# Patient Record
Sex: Female | Born: 1990 | Race: Black or African American | Hispanic: No | Marital: Single | Smoking: Former smoker
Health system: Southern US, Community
[De-identification: ages and names within clinical notes are randomized; demographics above are authoritative.]

## PROBLEM LIST (undated history)

## (undated) ENCOUNTER — Inpatient Hospital Stay (HOSPITAL_COMMUNITY): Payer: Self-pay

## (undated) DIAGNOSIS — D219 Benign neoplasm of connective and other soft tissue, unspecified: Secondary | ICD-10-CM

---

## 2013-08-21 ENCOUNTER — Inpatient Hospital Stay (HOSPITAL_COMMUNITY): Payer: BC Managed Care – PPO

## 2013-08-21 ENCOUNTER — Inpatient Hospital Stay (HOSPITAL_COMMUNITY)
Admission: AD | Admit: 2013-08-21 | Discharge: 2013-08-21 | Disposition: A | Discharge status: Home / Self Care | Payer: BC Managed Care – PPO | Source: Ambulatory Visit | Attending: Obstetrics & Gynecology | Admitting: Obstetrics & Gynecology

## 2013-08-21 ENCOUNTER — Encounter (HOSPITAL_COMMUNITY): Payer: Self-pay | Admitting: *Deleted

## 2013-08-21 DIAGNOSIS — R109 Unspecified abdominal pain: Secondary | ICD-10-CM | POA: Insufficient documentation

## 2013-08-21 DIAGNOSIS — O2390 Unspecified genitourinary tract infection in pregnancy, unspecified trimester: Secondary | ICD-10-CM | POA: Insufficient documentation

## 2013-08-21 DIAGNOSIS — O9989 Other specified diseases and conditions complicating pregnancy, childbirth and the puerperium: Secondary | ICD-10-CM

## 2013-08-21 DIAGNOSIS — A499 Bacterial infection, unspecified: Secondary | ICD-10-CM | POA: Insufficient documentation

## 2013-08-21 DIAGNOSIS — Z3490 Encounter for supervision of normal pregnancy, unspecified, unspecified trimester: Secondary | ICD-10-CM

## 2013-08-21 DIAGNOSIS — N760 Acute vaginitis: Secondary | ICD-10-CM | POA: Insufficient documentation

## 2013-08-21 DIAGNOSIS — B9689 Other specified bacterial agents as the cause of diseases classified elsewhere: Secondary | ICD-10-CM | POA: Insufficient documentation

## 2013-08-21 DIAGNOSIS — O26899 Other specified pregnancy related conditions, unspecified trimester: Secondary | ICD-10-CM

## 2013-08-21 LAB — WET PREP, GENITAL
Trich, Wet Prep: NONE SEEN
Yeast Wet Prep HPF POC: NONE SEEN

## 2013-08-21 LAB — URINALYSIS, ROUTINE W REFLEX MICROSCOPIC
Bilirubin Urine: NEGATIVE
Glucose, UA: NEGATIVE mg/dL
Ketones, ur: NEGATIVE mg/dL
Leukocytes, UA: NEGATIVE
Protein, ur: NEGATIVE mg/dL
pH: 7.5 (ref 5.0–8.0)

## 2013-08-21 LAB — CBC
Hemoglobin: 11.8 g/dL — ABNORMAL LOW (ref 12.0–15.0)
MCH: 28.2 pg (ref 26.0–34.0)
MCHC: 33.3 g/dL (ref 30.0–36.0)
MCV: 84.7 fL (ref 78.0–100.0)
RBC: 4.18 MIL/uL (ref 3.87–5.11)

## 2013-08-21 MED ORDER — TRAMADOL HCL 50 MG PO TABS
50.0000 mg | ORAL_TABLET | Freq: Four times a day (QID) | ORAL | Status: DC | PRN
Start: 2013-08-21 — End: 2014-03-25

## 2013-08-21 MED ORDER — METRONIDAZOLE 500 MG PO TABS: 500.0000 mg | ORAL_TABLET | Freq: Two times a day (BID) | ORAL | Status: DC

## 2013-08-21 NOTE — MAU Note (Signed)
Patient presents to MAU with lower abdominal cramping; unsure of last period.

## 2013-08-21 NOTE — MAU Note (Signed)
Lower abd cramping & pressure since last week, +HPT last Friday, denies bleeding.

## 2013-08-21 NOTE — MAU Provider Note (Signed)
History     CSN: [id number]  Arrival date and time: 08/21/13 1701   First Provider Initiated Contact with Patient 08/21/13 2026      Chief Complaint  Patient presents with  . Abdominal Pain   HPI  [name] is a 22 y.o. female; G2P0010 at Unknown gestational age who presents to MAU with abdominal pain. She had a positive pregnancy test at home this past Friday. She felt like she was "coming on her period" due to the abdominal cramps she was experiencing. She continues to have mild-moderate abdominal cramping in the lower abdominal area. The pain at times is worse on her right side.  She has not tried to take anything for the pain; considered taking tylenol however was unsure if something was seriously wrong. LMP is unknown, she does not recall when the last time her "period came on".   OB History   Grav Para Term Preterm Abortions TAB SAB Ect Mult Living   1               History reviewed. No pertinent past medical history.  History reviewed. No pertinent past surgical history.  History reviewed. No pertinent family history.  History  Substance Use Topics  . Smoking status: Former Smoker    Quit date: 08/17/2013  . Smokeless tobacco: Never Used  . Alcohol Use: No    Allergies: Allergies not on file  No prescriptions prior to admission    Review of Systems  Constitutional: Negative for fever and chills.  Gastrointestinal: Positive for abdominal pain. Negative for nausea, vomiting, diarrhea and constipation.       Bilateral lower abdominal pain; at times worse on the right side.   Genitourinary: Negative for dysuria, urgency and frequency.       No vaginal discharge. No vaginal bleeding. No dysuria.   Neurological: Negative for dizziness and headaches.   Physical Exam   Blood pressure 115/82, pulse 94, temperature 98.1 F (36.7 C), temperature source Oral, resp. rate 20, height 5\' 2"  (1.575 m), weight 68.584 kg (151 lb 3.2 oz).  Physical Exam   Constitutional: She is oriented to person, place, and time. She appears well-developed and well-nourished. No distress.  Neck: Neck supple.  GI: Soft. There is tenderness. There is no rebound and no guarding.  Right lower quadrant pain   Genitourinary:  Speculum exam: Vagina - Small amount of creamy discharge, no odor Cervix - scant contact bleeding Bimanual exam: Cervix closed Uterus non tender, Gravid; normal size Right Adnexal tenderness, no masses bilaterally GC/Chlam, wet prep done Chaperone present for exam.   Neurological: She is alert and oriented to person, place, and time.  Skin: Skin is warm and dry. She is not diaphoretic.    MAU Course  Procedures  MDM Wet prep GC/Chlamydia-pending  [place] Beta Hcg Declines the need for pain medication at this time  O positive blood type Report given to [place] CNM who resumes care of this patient   Assessment and Plan   Darlene Alar FNP-C 08/21/2013, 9:24 PM   Care of patient assumed by Dorathy Kinsman, CNM at 2100. Labs and ultrasound and process.  Results for orders placed during the hospital encounter of 08/21/13 (from the past 24 hour(s))  URINALYSIS, ROUTINE W REFLEX MICROSCOPIC     Status: None   Collection Time    08/21/13  5:08 PM      Result Value Range   Color, Urine YELLOW  YELLOW   APPearance CLEAR  CLEAR  Specific Gravity, Urine 1.020  1.005 - 1.030   pH 7.5  5.0 - 8.0   Glucose, UA NEGATIVE  NEGATIVE mg/dL   Hgb urine dipstick NEGATIVE  NEGATIVE   Bilirubin Urine NEGATIVE  NEGATIVE   Ketones, ur NEGATIVE  NEGATIVE mg/dL   Protein, ur NEGATIVE  NEGATIVE mg/dL   Urobilinogen, UA 1.0  0.0 - 1.0 mg/dL   Nitrite NEGATIVE  NEGATIVE   Leukocytes, UA NEGATIVE  NEGATIVE  POCT PREGNANCY, URINE     Status: Abnormal   Collection Time    08/21/13  7:21 PM      Result Value Range   Preg Test, Ur POSITIVE (*) NEGATIVE  CBC     Status: Abnormal   Collection Time    08/21/13  8:40 PM      Result  Value Range   WBC 5.6  4.0 - 10.5 K/uL   RBC 4.18  3.87 - 5.11 MIL/uL   Hemoglobin 11.8 (*) 12.0 - 15.0 g/dL   HCT 08.6 (*) 57.8 - 46.9 %   MCV 84.7  78.0 - 100.0 fL   MCH 28.2  26.0 - 34.0 pg   MCHC 33.3  30.0 - 36.0 g/dL   RDW 62.9  52.8 - 41.3 %   Platelets 247  150 - 400 K/uL  ABO/RH     Status: None   Collection Time    08/21/13  8:40 PM      Result Value Range   ABO/RH(D) O POS    HCG, QUANTITATIVE, PREGNANCY     Status: Abnormal   Collection Time    08/21/13  8:40 PM      Result Value Range   hCG, Beta Chain, Quant, S 1070 (*) <5 mIU/mL  WET PREP, GENITAL     Status: Abnormal   Collection Time    08/21/13  9:19 PM      Result Value Range   Yeast Wet Prep HPF POC NONE SEEN  NONE SEEN   Trich, Wet Prep NONE SEEN  NONE SEEN   Clue Cells Wet Prep HPF POC MODERATE (*) NONE SEEN   WBC, Wet Prep HPF POC FEW (*) NONE SEEN   US Ob Comp Less 14 Wks  08/21/2013   CLINICAL DATA:  Abdominal pain.  EXAM: OBSTETRIC <14 WK [place] AND TRANSVAGINAL OB US  TECHNIQUE: Both transabdominal and transvaginal ultrasound examinations were performed for complete evaluation of the gestation as well as the maternal uterus, adnexal regions, and pelvic cul-de-sac. Transvaginal technique was performed to assess early pregnancy.  COMPARISON:  No priors.  FINDINGS: Intrauterine gestational sac: Single ovoid shaped gestational sac in the fundal portion of the endometrial canal.  Yolk sac:  None.  Embryo:  None.  Cardiac Activity: None.  MSD:  3.2  mm   4 w   5  d          [place] EDC: 04/25/2014.  Maternal uterus/adnexae: There are multiple heterogeneously isoechoic to hypoechoic lesions in the uterus, compatible with multifocal fibroids. The largest of these is in the fundal region measuring 1.8 x 2.0 x 2.6 cm. Additional lesions include a posterior lesion in the uterine body measuring 9 mm, and a 1.7 x 1.1 x 1.3 cm lesion in the lower uterine segment. Right ovary is normal in echotexture and appearance. Probable  degenerating corpus luteum cyst in the left ovary incidentally noted. No significant free fluid in the cul-de-sac.  IMPRESSION: 1. Single IUP with estimated gestational age of [redacted] weeks and 5 days.  No fetal pole identified at this time. 2. Multiple small uterine lesions likely represent fibroids, as above.   Electronically Signed   By: Trudie Reed M.D.   On: 08/21/2013 22:02   US Ob Transvaginal  08/21/2013   CLINICAL DATA:  Abdominal pain.  EXAM: OBSTETRIC <14 WK [place] AND TRANSVAGINAL OB US  TECHNIQUE: Both transabdominal and transvaginal ultrasound examinations were performed for complete evaluation of the gestation as well as the maternal uterus, adnexal regions, and pelvic cul-de-sac. Transvaginal technique was performed to assess early pregnancy.  COMPARISON:  No priors.  FINDINGS: Intrauterine gestational sac: Single ovoid shaped gestational sac in the fundal portion of the endometrial canal.  Yolk sac:  None.  Embryo:  None.  Cardiac Activity: None.  MSD:  3.2  mm   4 w   5  d          [place] EDC: 04/25/2014.  Maternal uterus/adnexae: There are multiple heterogeneously isoechoic to hypoechoic lesions in the uterus, compatible with multifocal fibroids. The largest of these is in the fundal region measuring 1.8 x 2.0 x 2.6 cm. Additional lesions include a posterior lesion in the uterine body measuring 9 mm, and a 1.7 x 1.1 x 1.3 cm lesion in the lower uterine segment. Right ovary is normal in echotexture and appearance. Probable degenerating corpus luteum cyst in the left ovary incidentally noted. No significant free fluid in the cul-de-sac.  IMPRESSION: 1. Single IUP with estimated gestational age of [redacted] weeks and 5 days. No fetal pole identified at this time. 2. Multiple small uterine lesions likely represent fibroids, as above.   Electronically Signed   By: Trudie Reed M.D.   On: 08/21/2013 22:02   MDM  ASSESSMENT: 1. Abdominal pain complicating pregnancy, antepartum   2. Pregnancy with uncertain  dates   3. BV (bacterial vaginosis)     PLAN: Discharge home in stable condition. Low suspicion for appendicitis do to absence of leukocytosis or GI symptoms, exam. Ectopic, SAB and appendicitis precautions. Follow-up Information   Follow up with [facility] In 2 days. (for followup blood work or sooner as needed if symptoms worsen)    Contact information:   [address] [id number] [place] [address] [phone]       Medication List         metroNIDAZOLE 500 MG tablet  Commonly known as:  FLAGYL  Take 1 tablet (500 mg total) by mouth 2 (two) times daily.     prenatal multivitamin Tabs tablet  Take 1 tablet by mouth daily at 12 noon.     traMADol 50 MG tablet  Commonly known as:  ULTRAM  Take 1 tablet (50 mg total) by mouth every 6 (six) hours as needed for pain.        [place], [place] 08/21/2013 10:39 PM

## 2013-08-22 LAB — GC/CHLAMYDIA PROBE AMP: GC Probe RNA: NEGATIVE

## 2013-08-22 LAB — ABO/RH: ABO/RH(D): O POS

## 2013-08-23 ENCOUNTER — Inpatient Hospital Stay (HOSPITAL_COMMUNITY)
Admission: AD | Admit: 2013-08-23 | Discharge: 2013-08-23 | Disposition: A | Discharge status: Home / Self Care | Payer: BC Managed Care – PPO | Source: Ambulatory Visit | Attending: Obstetrics & Gynecology | Admitting: Obstetrics & Gynecology

## 2013-08-23 DIAGNOSIS — O9989 Other specified diseases and conditions complicating pregnancy, childbirth and the puerperium: Secondary | ICD-10-CM

## 2013-08-23 DIAGNOSIS — O99891 Other specified diseases and conditions complicating pregnancy: Secondary | ICD-10-CM | POA: Insufficient documentation

## 2013-08-23 DIAGNOSIS — R109 Unspecified abdominal pain: Secondary | ICD-10-CM

## 2013-08-23 DIAGNOSIS — O26899 Other specified pregnancy related conditions, unspecified trimester: Secondary | ICD-10-CM

## 2013-08-23 DIAGNOSIS — O0281 Inappropriate change in quantitative human chorionic gonadotropin (hCG) in early pregnancy: Secondary | ICD-10-CM | POA: Insufficient documentation

## 2013-08-23 LAB — HCG, QUANTITATIVE, PREGNANCY: hCG, Beta Chain, Quant, S: 2500 m[IU]/mL — ABNORMAL HIGH (ref <5)

## 2013-08-23 NOTE — MAU Note (Signed)
Pt here for follow up blood work.  No bleeding, cramping continues

## 2013-08-23 NOTE — MAU Provider Note (Signed)
[name] is a 22 y.o. G1P0 at Unknown who presents to MAU today for 48 quant hCG. The patient is having mild lower abdominal cramping. She denies bleeding or other problems. Quant hCG was 1070 on 08/21/13.    GENERAL: Well-developed, well-nourished female in no acute distress.  HEENT: Normocephalic, atraumatic.   LUNGS: Effort normal HEART: Regular rate  SKIN: Warm, dry and without erythema PSYCH: Normal mood and affect  Results for orders placed during the hospital encounter of 08/23/13 (from the past 24 hour(s))  HCG, QUANTITATIVE, PREGNANCY     Status: Abnormal   Collection Time    08/23/13  4:03 PM      Result Value Range   hCG, Beta Chain, Quant, S 2500 (*) <5 mIU/mL   A: Appropriate rise in quant hCG  P: Discharge home [place] ordered for 7 days for follow-up to confirm IUP Patient will be contacted with an appointment Patient plans to start prenatal care with [facility] Patient may return to MAU as needed or if her condition were to change or worsen  Freddi Starr, PA-C 08/23/2013 5:04 PM

## 2013-08-27 NOTE — MAU Provider Note (Signed)
Attestation of Attending Supervision of Advanced Practitioner (CNM/NP): Evaluation and management procedures were performed by the Advanced Practitioner under my supervision and collaboration. I have reviewed the Advanced Practitioner's note and chart, and I agree with the management and plan.  Axzel Rockhill H. 7:46 AM

## 2013-08-30 ENCOUNTER — Ambulatory Visit (HOSPITAL_COMMUNITY)
Admission: RE | Admit: 2013-08-30 | Discharge: 2013-08-30 | Disposition: A | Discharge status: Home / Self Care | Payer: BC Managed Care – PPO | Source: Ambulatory Visit | Attending: Medical | Admitting: Medical

## 2013-08-30 DIAGNOSIS — O99891 Other specified diseases and conditions complicating pregnancy: Secondary | ICD-10-CM | POA: Insufficient documentation

## 2013-08-31 ENCOUNTER — Telehealth: Payer: Self-pay | Admitting: Medical

## 2013-08-31 NOTE — Telephone Encounter (Signed)
Called patient and discussed [place] results. She has viable IUP with cardiac activity. Patient states she has her first prenatal appointment with [facility] on Monday.   Freddi Starr, PA-C 08/31/2013 8:06 AM

## 2014-03-25 ENCOUNTER — Inpatient Hospital Stay (HOSPITAL_COMMUNITY)
Admission: AD | Admit: 2014-03-25 | Discharge: 2014-03-25 | Disposition: A | Discharge status: Home / Self Care | Payer: BC Managed Care – PPO | Source: Ambulatory Visit | Attending: Obstetrics & Gynecology | Admitting: Obstetrics & Gynecology

## 2014-03-25 ENCOUNTER — Encounter (HOSPITAL_COMMUNITY): Payer: Self-pay

## 2014-03-25 DIAGNOSIS — M549 Dorsalgia, unspecified: Secondary | ICD-10-CM | POA: Diagnosis present

## 2014-03-25 DIAGNOSIS — O99891 Other specified diseases and conditions complicating pregnancy: Secondary | ICD-10-CM | POA: Insufficient documentation

## 2014-03-25 DIAGNOSIS — R350 Frequency of micturition: Secondary | ICD-10-CM | POA: Diagnosis not present

## 2014-03-25 DIAGNOSIS — O9989 Other specified diseases and conditions complicating pregnancy, childbirth and the puerperium: Secondary | ICD-10-CM

## 2014-03-25 DIAGNOSIS — O26893 Other specified pregnancy related conditions, third trimester: Secondary | ICD-10-CM

## 2014-03-25 DIAGNOSIS — R300 Dysuria: Secondary | ICD-10-CM

## 2014-03-25 DIAGNOSIS — R319 Hematuria, unspecified: Secondary | ICD-10-CM

## 2014-03-25 HISTORY — DX: Benign neoplasm of connective and other soft tissue, unspecified: D21.9

## 2014-03-25 LAB — URINALYSIS, ROUTINE W REFLEX MICROSCOPIC
Bilirubin Urine: NEGATIVE
GLUCOSE, UA: NEGATIVE mg/dL
Ketones, ur: NEGATIVE mg/dL
LEUKOCYTES UA: NEGATIVE
Nitrite: NEGATIVE
PH: 7 (ref 5.0–8.0)
Protein, ur: NEGATIVE mg/dL
Specific Gravity, Urine: 1.02 (ref 1.005–1.030)
Urobilinogen, UA: 0.2 mg/dL (ref 0.0–1.0)

## 2014-03-25 LAB — URINE MICROSCOPIC-ADD ON

## 2014-03-25 NOTE — Discharge Instructions (Signed)

## 2014-03-25 NOTE — MAU Provider Note (Signed)
Chief Complaint:  Dysuria   First Provider Initiated Contact with Patient 03/25/14 1751      HPI: Darlene Ray is a 23 y.o. G1P0 at [redacted]w[redacted]d pt of OB/Gyn in [place] who presents to maternity admissions reporting pressure with urination.  She reports drinking lots of cranberry juice when she was concerned she may be developing a UTI.  She reports good fetal movement, denies regular contractions, LOF, vaginal bleeding, vaginal itching/burning, h/a, dizziness, n/v, or fever/chills.    Past Medical History: Past Medical History  Diagnosis Date  . Fibroid     Past obstetric history: OB History  Gravida Para Term Preterm AB SAB TAB Ectopic Multiple Living  1             # Outcome Date GA Lbr Len/2nd Weight Sex Delivery Anes PTL Lv  1 CUR               Past Surgical History: History reviewed. No pertinent past surgical history.  Family History: No family history on file.  Social History: History  Substance Use Topics  . Smoking status: Former Smoker    Quit date: 08/17/2013  . Smokeless tobacco: Never Used  . Alcohol Use: No    Allergies: No Known Allergies  Meds:  No prescriptions prior to admission    ROS: Pertinent findings in history of present illness.  Physical Exam  Blood pressure 108/77, pulse 87, temperature 98.5 F (36.9 C), resp. rate 18, height 5\' 1"  (1.549 m), weight 75.524 kg (166 lb 8 oz). GENERAL: Well-developed, well-nourished female in no acute distress.  HEENT: normocephalic HEART: normal rate RESP: normal effort ABDOMEN: Soft, non-tender, gravid appropriate for gestational age EXTREMITIES: Nontender, no edema NEURO: alert and oriented SPECULUM EXAM: Deferred     FHT:  Baseline 125, moderate variability, accelerations present, no decelerations Contractions: None on toco or to palpation   Labs: Results for orders placed during the hospital encounter of 03/25/14 (from the past 168 hour(s))  URINE CULTURE   Collection Time    03/25/14  4:05 PM       Result Value Ref Range   Specimen Description URINE, CLEAN CATCH     Special Requests NONE     Culture  Setup Time       Value: 03/25/2014 20:24     Performed at [facility]       Value: NO GROWTH     Performed at [facility]   Culture       Value: NO GROWTH     Performed at [facility]   Report Status 03/26/2014 FINAL    URINALYSIS, ROUTINE W REFLEX MICROSCOPIC   Collection Time    03/25/14  4:05 PM      Result Value Ref Range   Color, Urine YELLOW  YELLOW   APPearance CLEAR  CLEAR   Specific Gravity, Urine 1.020  1.005 - 1.030   pH 7.0  5.0 - 8.0   Glucose, UA NEGATIVE  NEGATIVE mg/dL   Hgb urine dipstick LARGE (*) NEGATIVE   Bilirubin Urine NEGATIVE  NEGATIVE   Ketones, ur NEGATIVE  NEGATIVE mg/dL   Protein, ur NEGATIVE  NEGATIVE mg/dL   Urobilinogen, UA 0.2  0.0 - 1.0 mg/dL   Nitrite NEGATIVE  NEGATIVE   Leukocytes, UA NEGATIVE  NEGATIVE  URINE MICROSCOPIC-ADD ON   Collection Time    03/25/14  4:05 PM      Result Value Ref Range   Squamous Epithelial /  LPF FEW (*) RARE   WBC, UA 0-2  <3 WBC/hpf   RBC / HPF 21-50  <3 RBC/hpf     Assessment: 1. Dysuria in pregnancy in third trimester   2. Hematuria     Plan: Discharge home PTL precautions and fetal kick counts Discussed normal changes of pregnancy, hematuria likely r/t fetal position/movement.  She should let her OB provider know so this can be retested. Drink plenty of water, decrease juice intake Urine sent for culture F/U with OB provider as scheduled      Follow-up Information   Please follow up. (With your prenatal provider in [place]. Return to MAU as needed for emergencies. )        Medication List         IRON SUPPLEMENT 325 (65 FE) MG tablet  Generic drug:  ferrous sulfate  Take 325 mg by mouth daily with breakfast.     prenatal multivitamin Tabs tablet  Take 1 tablet by mouth daily at 12 noon.     terconazole 80 MG vaginal suppository   Commonly known as:  TERAZOL 3  Place 80 mg vaginally at bedtime.        Fatima Blank Certified Nurse-Midwife 03/29/2014 10:09 PM

## 2014-03-25 NOTE — MAU Note (Signed)
Pt states had back pain Saturday, then began having urinary frequency/urgency/irritation on Sunday. Denies bleeding or vag d/c changes.

## 2014-03-26 LAB — URINE CULTURE
Colony Count: NO GROWTH
Culture: NO GROWTH

## 2014-04-02 NOTE — MAU Provider Note (Signed)
Attestation of Attending Supervision of Advanced Practitioner (CNM/NP): Evaluation and management procedures were performed by the Advanced Practitioner under my supervision and collaboration. I have reviewed the Advanced Practitioner's note and chart, and I agree with the management and plan.  Fredderick Phenix Nicholos Aloisi 1:52 PM

## 2014-06-26 ENCOUNTER — Encounter (HOSPITAL_COMMUNITY): Payer: Self-pay | Admitting: *Deleted

## 2014-10-07 ENCOUNTER — Encounter (HOSPITAL_COMMUNITY): Payer: Self-pay | Admitting: *Deleted

## 2015-04-25 IMAGING — US US OB COMP LESS 14 WK
1 series · 13 of 26 positions shown · non-contrast
Comparison: No priors.

CLINICAL DATA: Abdominal pain.

EXAM:
OBSTETRIC <14 WK US AND TRANSVAGINAL OB US
TECHNIQUE: Both transabdominal and transvaginal ultrasound examinations were
performed for complete evaluation of the gestation as well as the
maternal uterus, adnexal regions, and pelvic cul-de-sac.
Transvaginal technique was performed to assess early pregnancy.

[Series 1: us ob comp less 14 wks · 26 acquisitions, 13 frames shown]
[im 2/26]
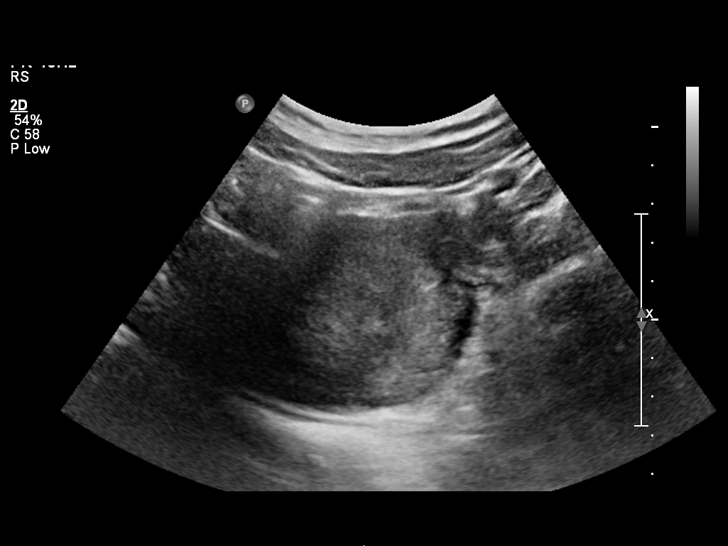
[im 4/26]
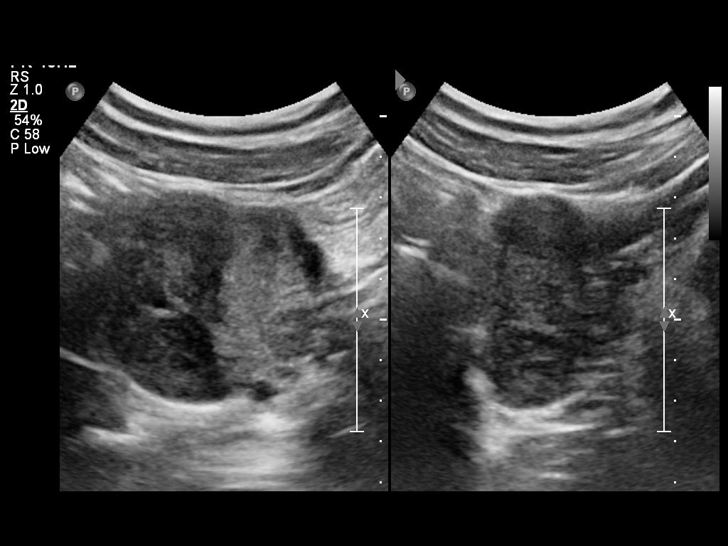
[im 6/26]
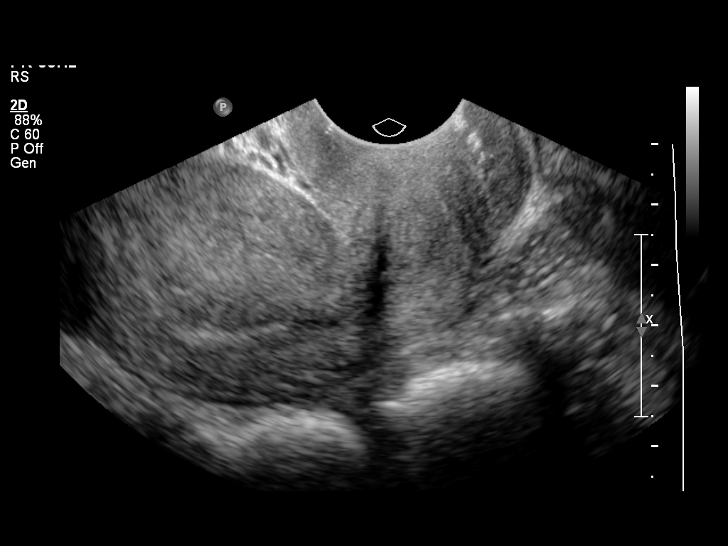
[im 8/26]
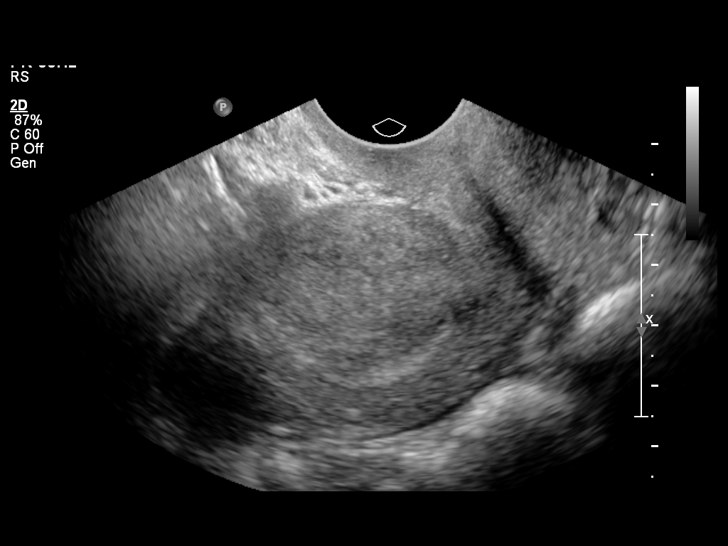
[im 10/26]
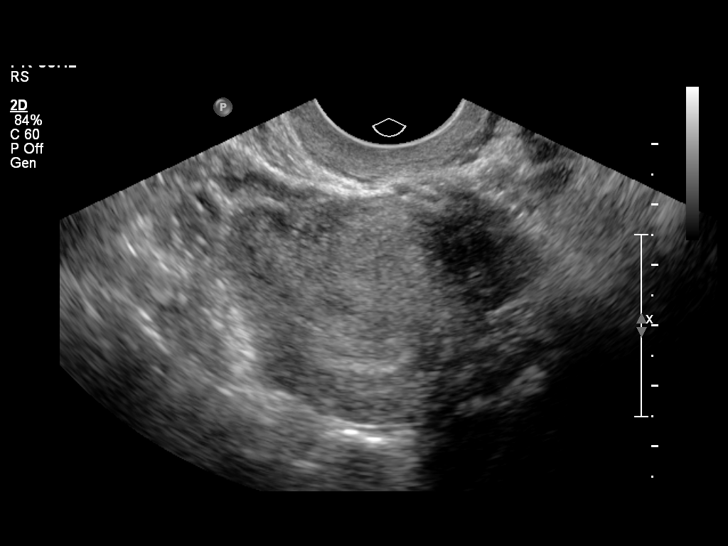
[im 12/26]
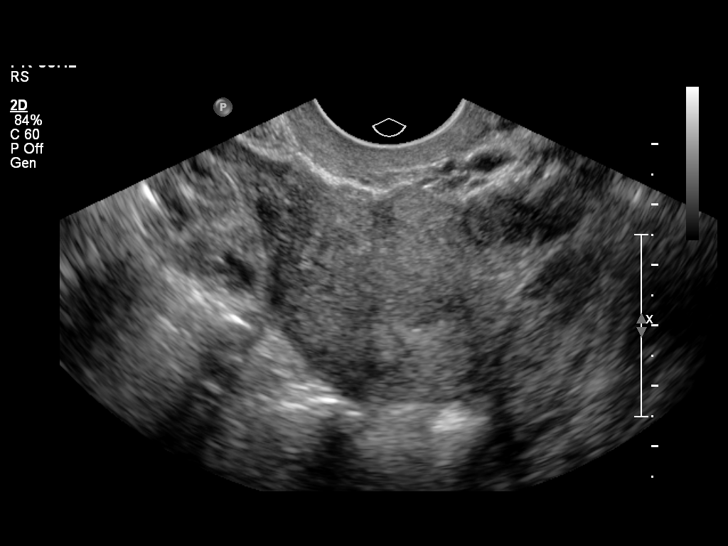
[im 14/26]
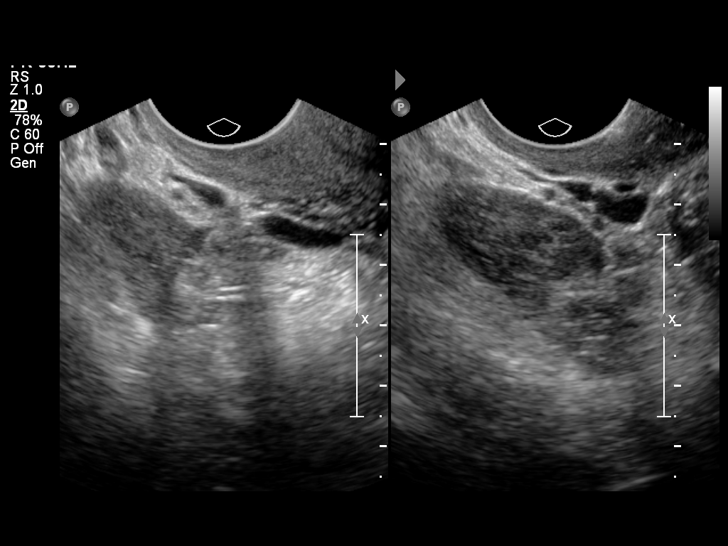
[im 16/26]
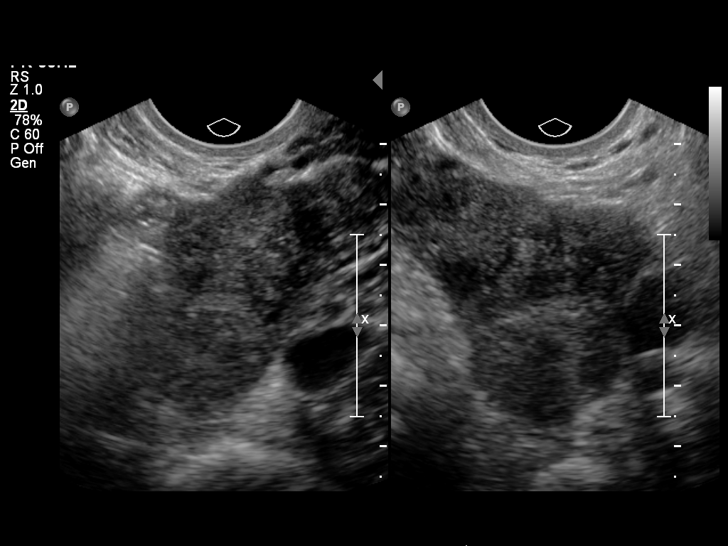
[im 18/26]
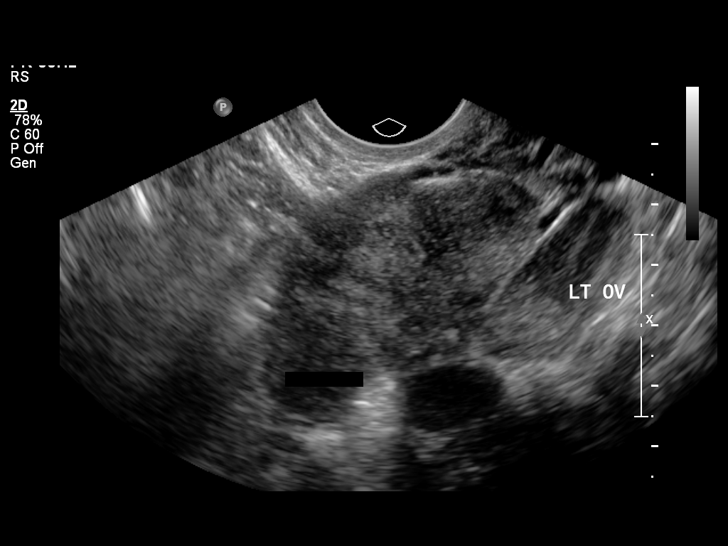
[im 20/26]
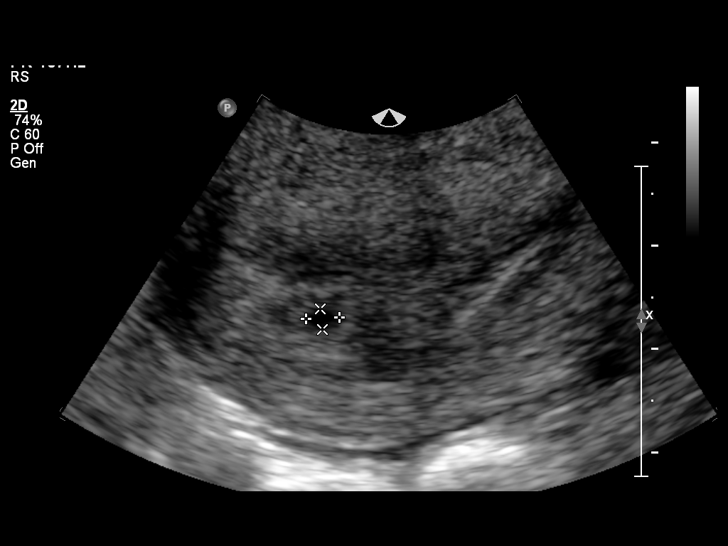
[im 22/26]
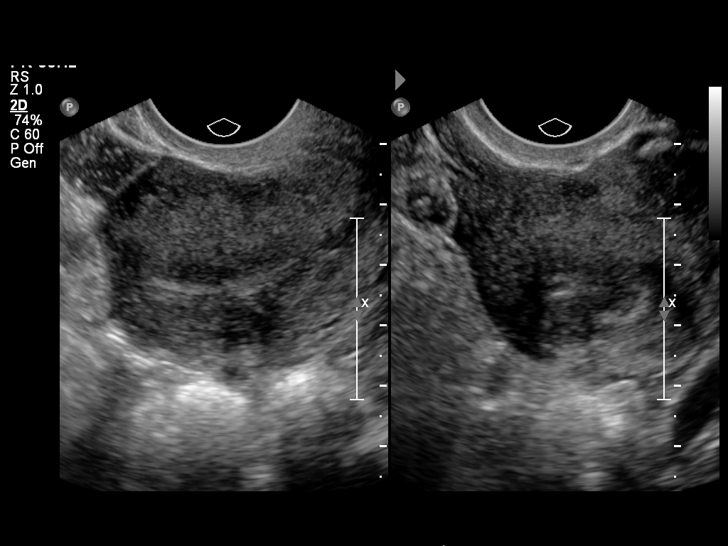
[im 24/26]
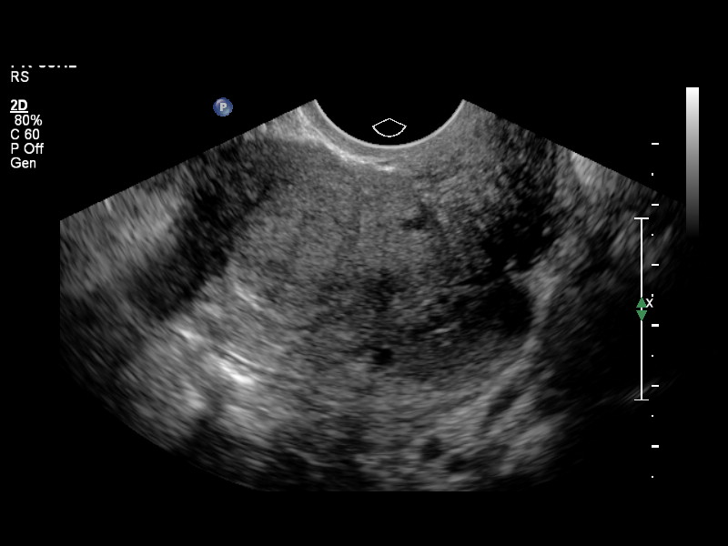
[im 26/26]
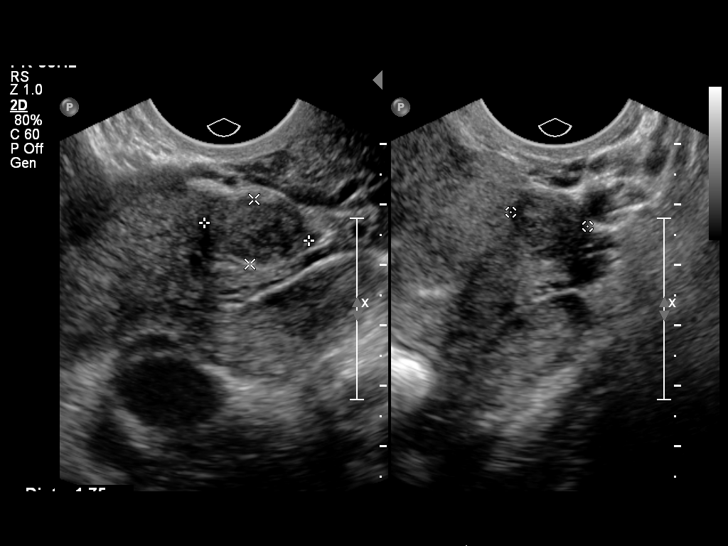

[13 of 26 positions shown; findings below may reference images not displayed]

FINDINGS: Intrauterine gestational sac: Single ovoid shaped gestational sac in
the fundal portion of the endometrial canal.

Yolk sac:  None.

Embryo:  None.

Cardiac Activity: None.

MSD:  3.2  mm   4 w   5  d          US EDC: 04/25/2014.

Maternal uterus/adnexae: There are multiple heterogeneously
isoechoic to hypoechoic lesions in the uterus, compatible with
multifocal fibroids. The largest of these is in the fundal region
measuring 1.8 x 2.0 x 2.6 cm. Additional lesions include a posterior
lesion in the uterine body measuring 9 mm, and a 1.7 x 1.1 x 1.3 cm
lesion in the lower uterine segment. Right ovary is normal in
echotexture and appearance. Probable degenerating corpus luteum cyst
in the left ovary incidentally noted. No significant free fluid in
the cul-de-sac.
IMPRESSION: 1. Single IUP with estimated gestational age of 4 weeks and 5 days.
No fetal pole identified at this time.
2. Multiple small uterine lesions likely represent fibroids, as
above.

## 2015-05-04 IMAGING — US US OB TRANSVAGINAL
1 series · 14 of 28 positions shown · non-contrast
Comparison: 08/21/2013

CLINICAL DATA: FOLLOW UP early pregnancy, gestational sac
previously seen but no fetal pole identified. Unsure dates.

EXAM:
TRANSVAGINAL OB ULTRASOUND
TECHNIQUE: Transvaginal ultrasound was performed for complete evaluation of the
gestation as well as the maternal uterus, adnexal regions, and
pelvic cul-de-sac.

[Series 1: us ob transvaginal · 14 of 47 slices shown]
[im 2/47]
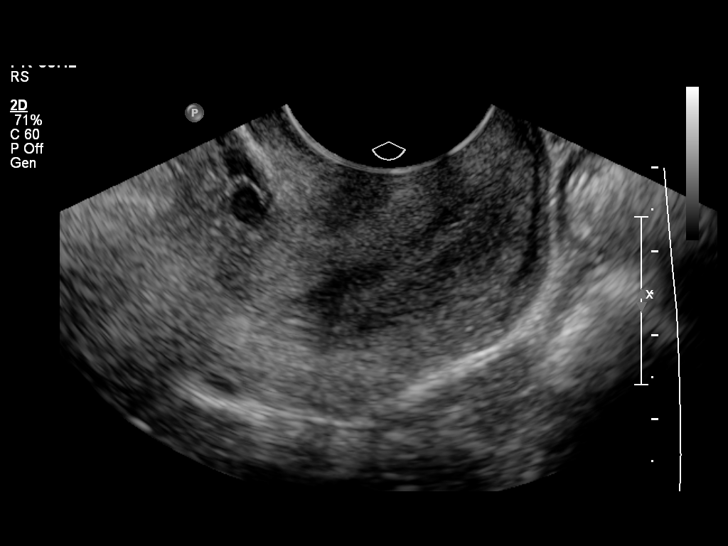
[im 6/47]
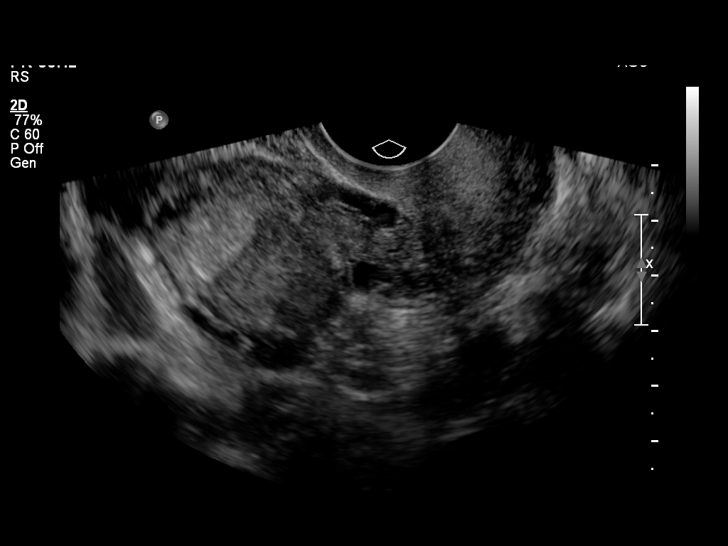
[im 9/47]
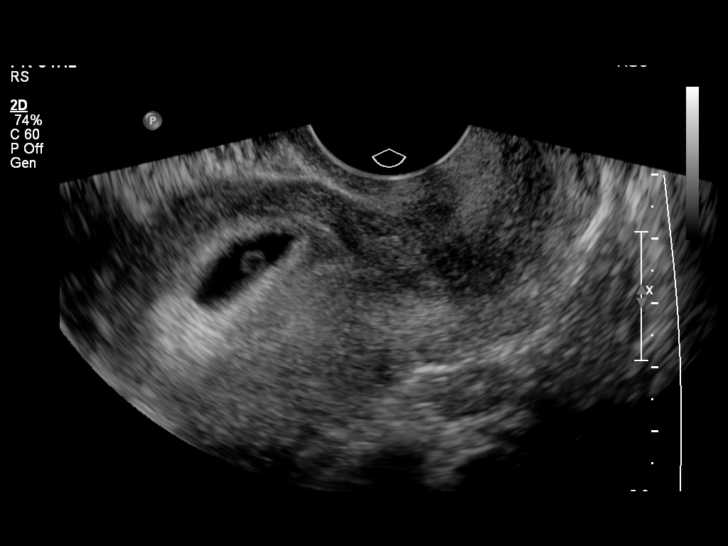
[im 12/47]
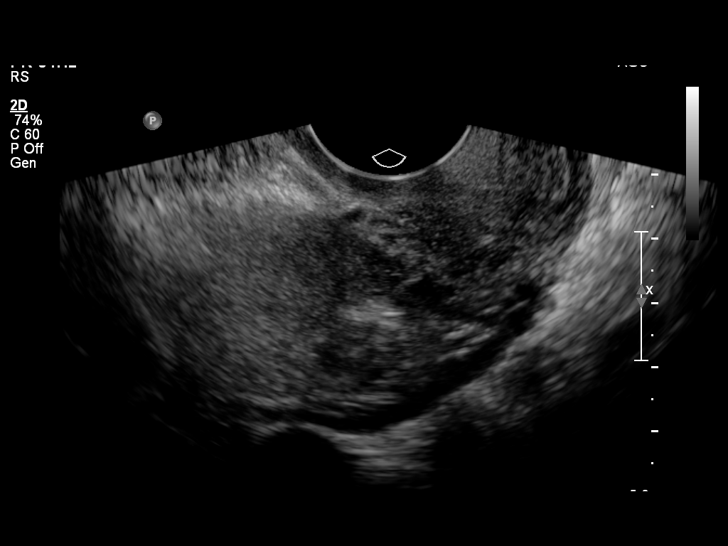
[im 16/47]
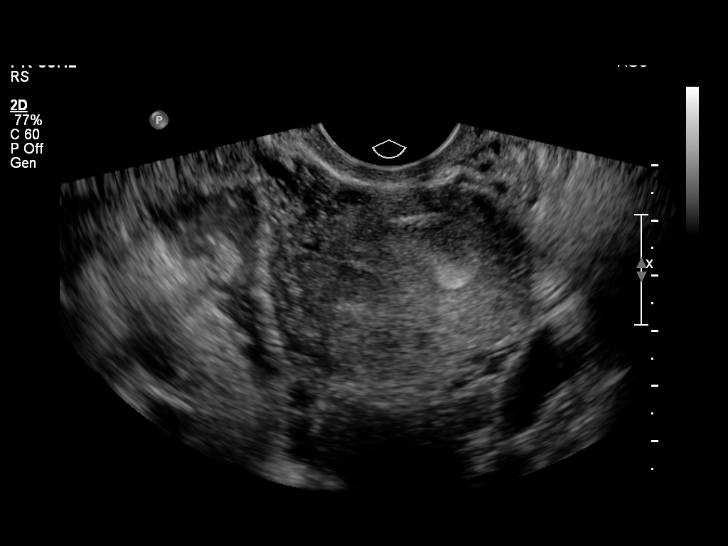
[im 19/47]
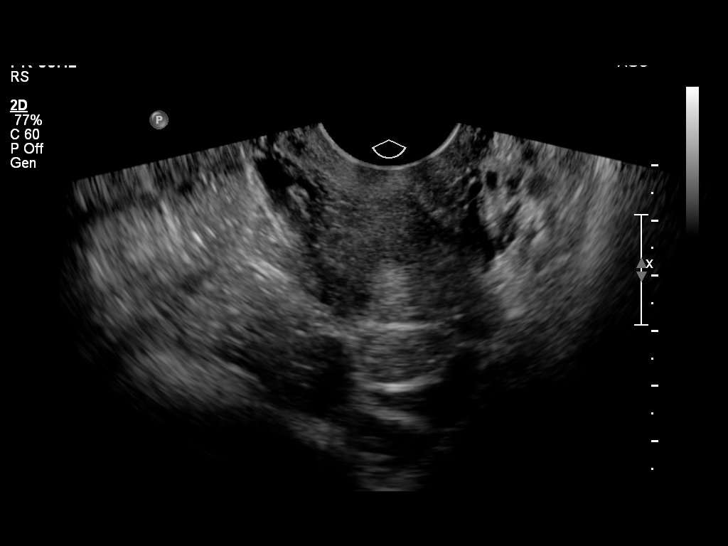
[im 23/47]
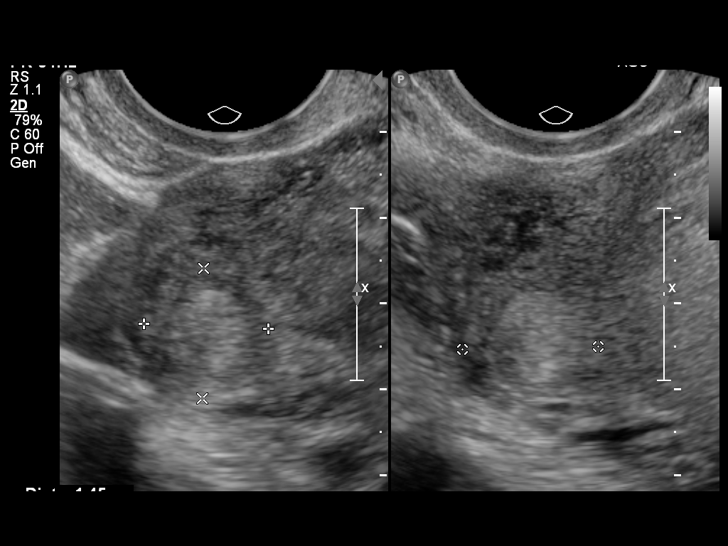
[im 26/47]
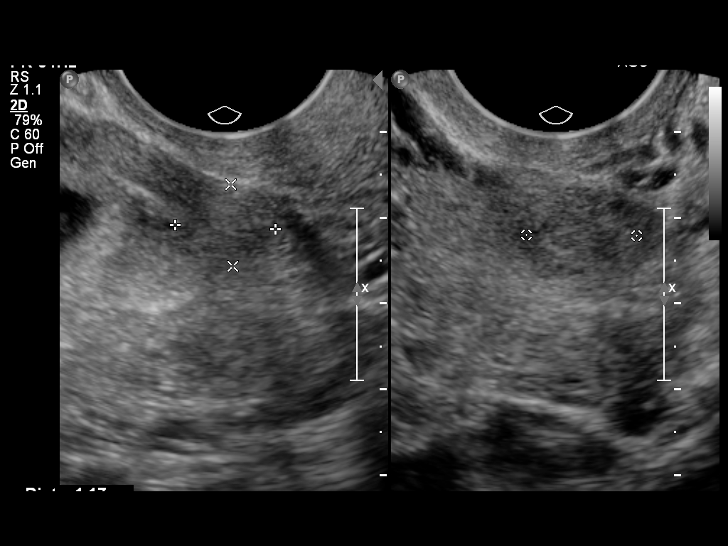
[im 29/47]
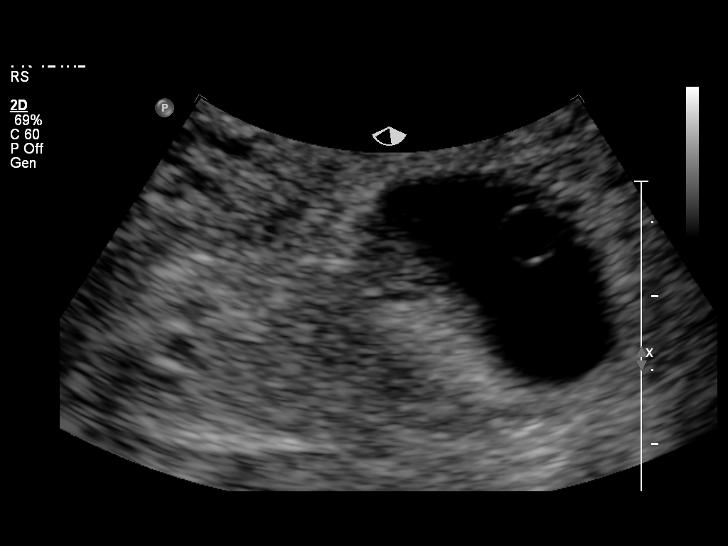
[im 33/47]
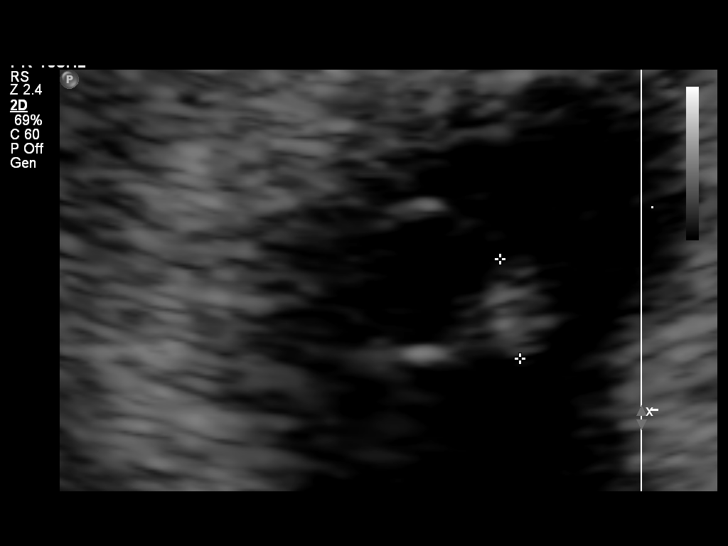
[im 36/47]
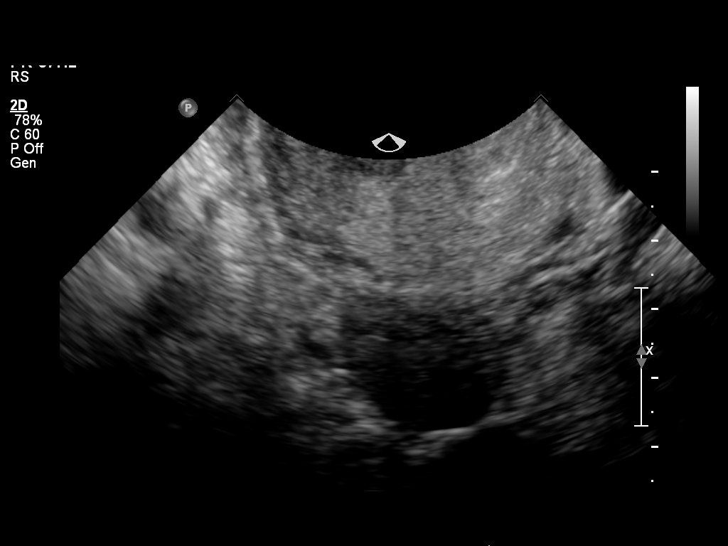
[im 40/47]
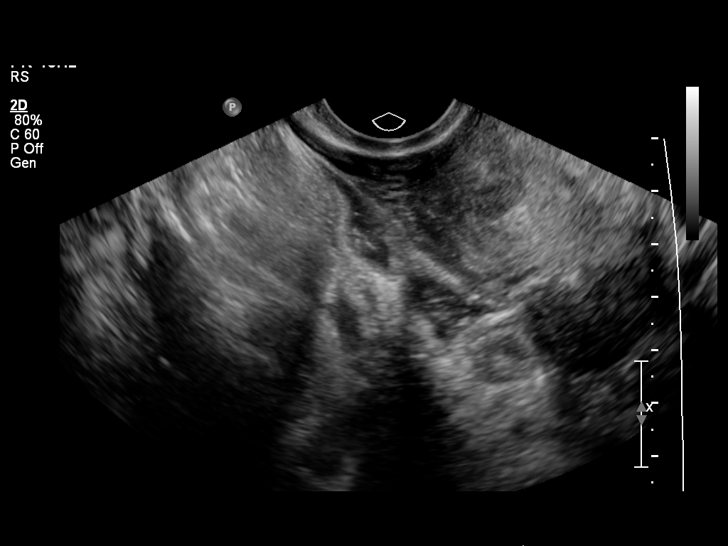
[im 43/47]
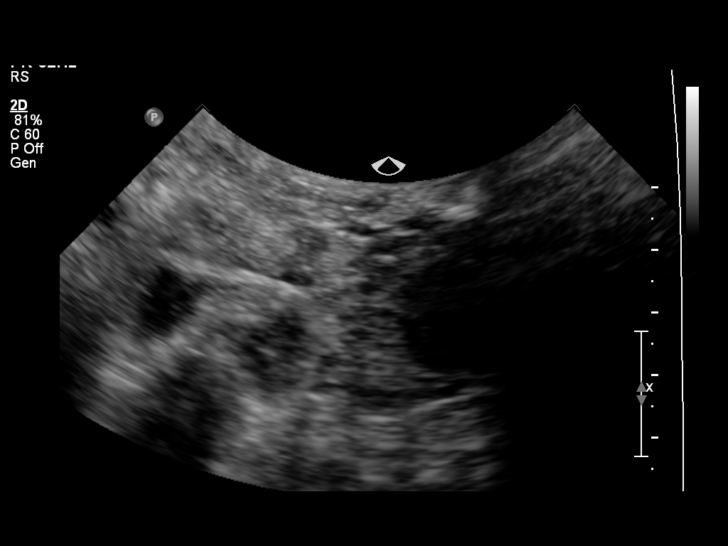
[im 47/47]
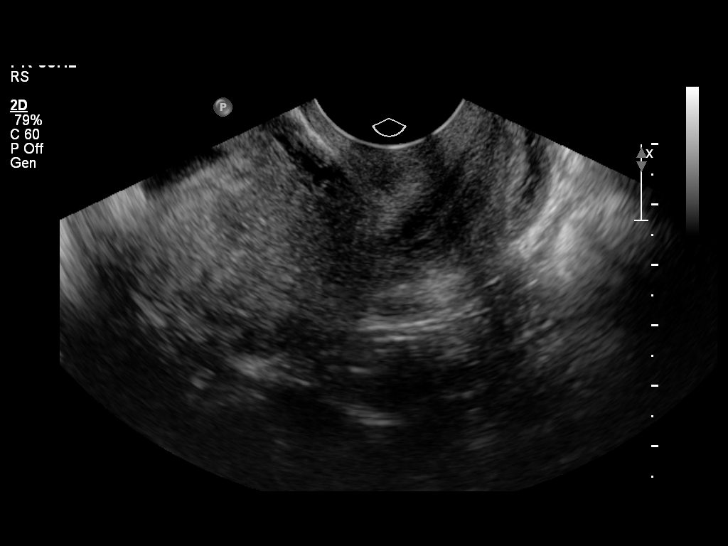

[14 of 28 positions shown; findings below may reference images not displayed]

FINDINGS: Intrauterine gestational sac: Visualized/normal in shape.

Yolk sac:  Visualized

Embryo:  Visualized

Cardiac Activity: Visualized

Heart Rate: 102 bpm

CRL:   2  mm   5 w 6d                  US EDC: 04/26/14

Maternal uterus/adnexae: The ovaries are normal.
IMPRESSION: Intrauterine gestational sac, yolk sac, fetal pole, and cardiac
activity identified today. Suggest best dating by today's exam, 5
weeks 6 days by crown-rump length, EDC by ultrasound 04/26/2014.
